# Patient Record
Sex: Female | Born: 1950 | Race: White | Hispanic: No | Marital: Married | Smoking: Never smoker
Health system: Southern US, Community
[De-identification: ages and names within clinical notes are randomized; demographics above are authoritative.]

---

## 2001-07-07 ENCOUNTER — Encounter: Payer: Self-pay | Admitting: Internal Medicine

## 2001-07-07 ENCOUNTER — Encounter: Admission: RE | Admit: 2001-07-07 | Discharge: 2001-07-07 | Payer: Self-pay | Admitting: Internal Medicine

## 2001-07-10 ENCOUNTER — Other Ambulatory Visit: Admission: RE | Admit: 2001-07-10 | Discharge: 2001-07-10 | Payer: Self-pay | Admitting: Internal Medicine

## 2002-07-29 ENCOUNTER — Encounter: Admission: RE | Admit: 2002-07-29 | Discharge: 2002-07-29 | Payer: Self-pay | Admitting: Internal Medicine

## 2002-07-29 ENCOUNTER — Encounter: Payer: Self-pay | Admitting: Internal Medicine

## 2003-08-01 ENCOUNTER — Encounter: Admission: RE | Admit: 2003-08-01 | Discharge: 2003-08-01 | Payer: Self-pay | Admitting: Internal Medicine

## 2004-08-27 ENCOUNTER — Encounter: Admission: RE | Admit: 2004-08-27 | Discharge: 2004-08-27 | Payer: Self-pay | Admitting: Internal Medicine

## 2005-09-11 ENCOUNTER — Encounter: Admission: RE | Admit: 2005-09-11 | Discharge: 2005-09-11 | Payer: Self-pay | Admitting: Internal Medicine

## 2006-09-17 ENCOUNTER — Encounter: Admission: RE | Admit: 2006-09-17 | Discharge: 2006-09-17 | Payer: Self-pay | Admitting: Internal Medicine

## 2007-09-20 ENCOUNTER — Encounter: Admission: RE | Admit: 2007-09-20 | Discharge: 2007-09-20 | Payer: Self-pay | Admitting: Internal Medicine

## 2008-09-20 ENCOUNTER — Encounter: Admission: RE | Admit: 2008-09-20 | Discharge: 2008-09-20 | Payer: Self-pay | Admitting: Internal Medicine

## 2009-09-24 ENCOUNTER — Encounter: Admission: RE | Admit: 2009-09-24 | Discharge: 2009-09-24 | Payer: Self-pay | Admitting: Internal Medicine

## 2009-10-15 ENCOUNTER — Encounter: Admission: RE | Admit: 2009-10-15 | Discharge: 2009-10-15 | Payer: Self-pay | Admitting: Internal Medicine

## 2010-09-27 ENCOUNTER — Other Ambulatory Visit: Payer: Self-pay | Admitting: Internal Medicine

## 2010-09-27 DIAGNOSIS — Z1231 Encounter for screening mammogram for malignant neoplasm of breast: Secondary | ICD-10-CM

## 2010-10-08 ENCOUNTER — Ambulatory Visit
Admission: RE | Admit: 2010-10-08 | Discharge: 2010-10-08 | Disposition: A | Discharge status: Home / Self Care | Payer: BC Managed Care – PPO | Source: Ambulatory Visit | Attending: Internal Medicine | Admitting: Internal Medicine

## 2010-10-08 DIAGNOSIS — Z1231 Encounter for screening mammogram for malignant neoplasm of breast: Secondary | ICD-10-CM

## 2010-10-13 IMAGING — CR DG WRIST COMPLETE 3+V*L*
2 series · 2 of 2 positions shown · non-contrast
Comparison: None.

CLINICAL DATA: Left wrist swelling and pain.

LEFT WRIST - COMPLETE 3+ VIEW

[view not recorded (1 of 2)]
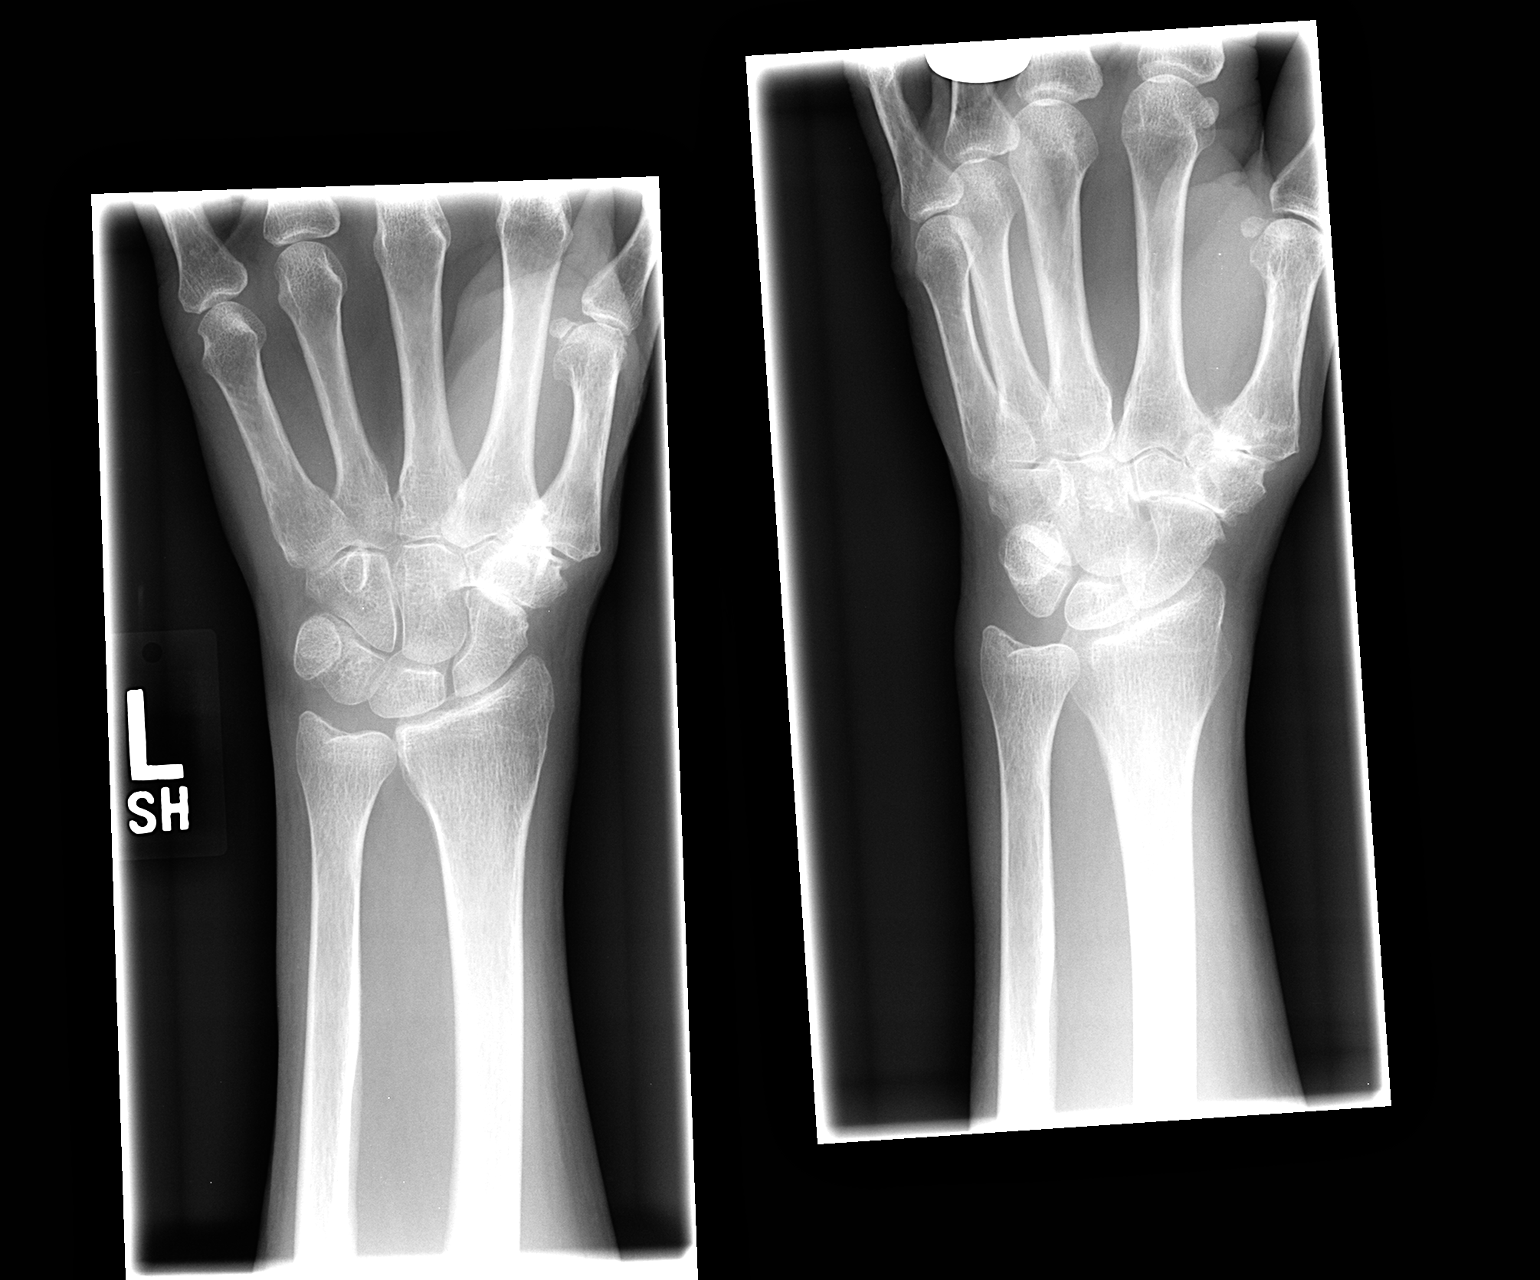

[view not recorded (2 of 2)]
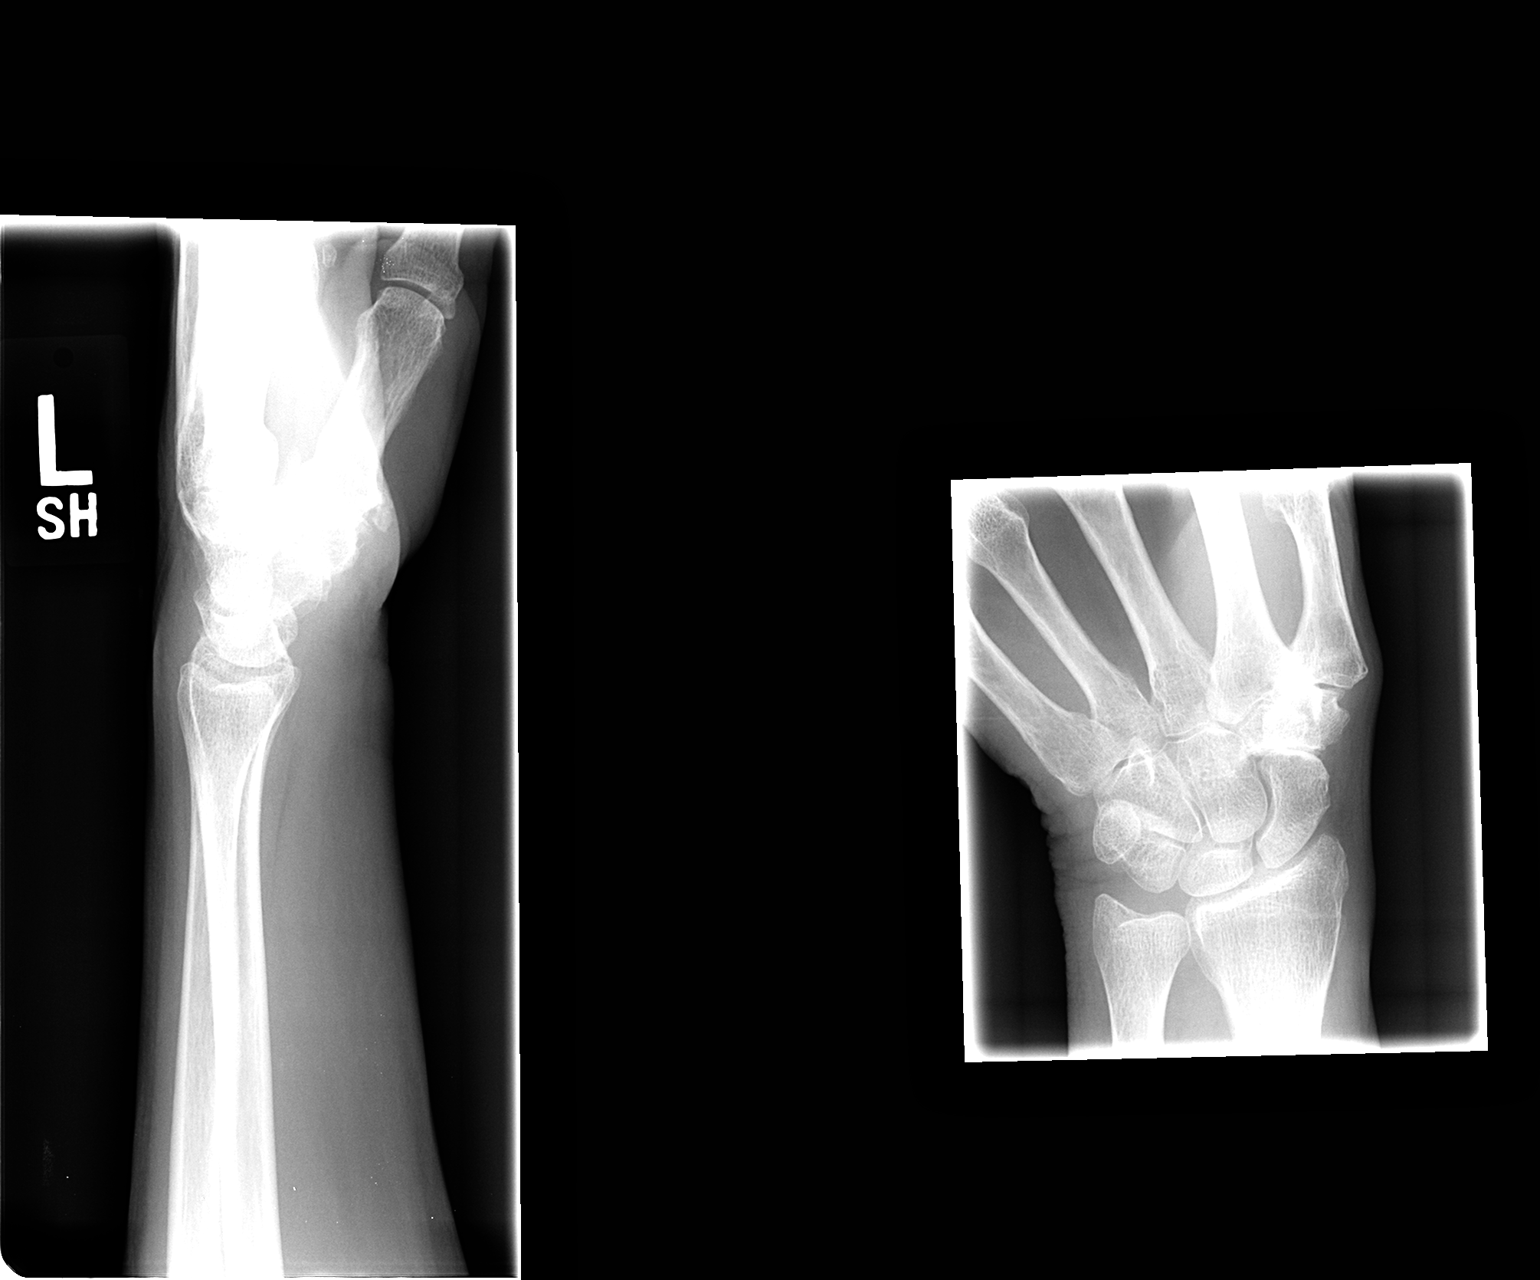

[2 of 2 positions shown; findings below may reference images not displayed]

FINDINGS: There are degenerative changes at the first
carpometacarpal and scaphoid trapezium trapezoid joints.  No
evidence of acute fracture.
IMPRESSION: First carpometacarpal and STT joint osteoarthritis.

## 2019-02-16 DIAGNOSIS — H52223 Regular astigmatism, bilateral: Secondary | ICD-10-CM | POA: Diagnosis not present

## 2019-02-16 DIAGNOSIS — H18429 Band keratopathy, unspecified eye: Secondary | ICD-10-CM | POA: Diagnosis not present

## 2019-02-16 DIAGNOSIS — H5213 Myopia, bilateral: Secondary | ICD-10-CM | POA: Diagnosis not present

## 2019-02-16 DIAGNOSIS — H18603 Keratoconus, unspecified, bilateral: Secondary | ICD-10-CM | POA: Diagnosis not present

## 2019-03-16 ENCOUNTER — Other Ambulatory Visit: Payer: Self-pay

## 2019-03-27 DIAGNOSIS — Z111 Encounter for screening for respiratory tuberculosis: Secondary | ICD-10-CM | POA: Diagnosis not present

## 2019-03-27 DIAGNOSIS — Z0289 Encounter for other administrative examinations: Secondary | ICD-10-CM | POA: Diagnosis not present

## 2019-03-27 DIAGNOSIS — R030 Elevated blood-pressure reading, without diagnosis of hypertension: Secondary | ICD-10-CM | POA: Diagnosis not present

## 2019-07-13 ENCOUNTER — Other Ambulatory Visit: Payer: Self-pay

## 2020-01-31 ENCOUNTER — Encounter (HOSPITAL_COMMUNITY): Payer: Self-pay | Admitting: Emergency Medicine

## 2020-01-31 ENCOUNTER — Other Ambulatory Visit: Payer: Self-pay

## 2020-01-31 DIAGNOSIS — W228XXA Striking against or struck by other objects, initial encounter: Secondary | ICD-10-CM | POA: Diagnosis not present

## 2020-01-31 DIAGNOSIS — Z23 Encounter for immunization: Secondary | ICD-10-CM | POA: Diagnosis not present

## 2020-01-31 DIAGNOSIS — Y999 Unspecified external cause status: Secondary | ICD-10-CM | POA: Diagnosis not present

## 2020-01-31 DIAGNOSIS — Y92019 Unspecified place in single-family (private) house as the place of occurrence of the external cause: Secondary | ICD-10-CM | POA: Diagnosis not present

## 2020-01-31 DIAGNOSIS — S0101XA Laceration without foreign body of scalp, initial encounter: Secondary | ICD-10-CM | POA: Diagnosis not present

## 2020-01-31 DIAGNOSIS — Y9389 Activity, other specified: Secondary | ICD-10-CM | POA: Diagnosis not present

## 2020-01-31 NOTE — ED Triage Notes (Signed)
Pt hit the left side her head on a camper shell and has a laceration, bleeding controlled.

## 2020-02-01 ENCOUNTER — Emergency Department (HOSPITAL_COMMUNITY)
Admission: EM | Admit: 2020-02-01 | Discharge: 2020-02-01 | Disposition: A | Discharge status: Home / Self Care | Payer: PPO | Attending: Emergency Medicine | Admitting: Emergency Medicine

## 2020-02-01 DIAGNOSIS — S0101XA Laceration without foreign body of scalp, initial encounter: Secondary | ICD-10-CM

## 2020-02-01 MED ORDER — TETANUS-DIPHTH-ACELL PERTUSSIS 5-2.5-18.5 LF-MCG/0.5 IM SUSP
0.5000 mL | Freq: Once | INTRAMUSCULAR | Status: AC
  Administered 2020-02-01: 0.5 mL via INTRAMUSCULAR
  Filled 2020-02-01: qty 0.5

## 2020-02-01 MED ORDER — LIDOCAINE-EPINEPHRINE-TETRACAINE (LET) TOPICAL GEL
3.0000 mL | Freq: Once | TOPICAL | Status: AC
  Administered 2020-02-01: 3 mL via TOPICAL
  Filled 2020-02-01: qty 3

## 2020-02-01 NOTE — ED Provider Notes (Signed)
[facility] EMERGENCY DEPARTMENT Provider Note   CSN: [id number] Arrival date & time: 01/31/20  2055   Time seen 1:30 AM  History Chief Complaint  Patient presents with  . Head Laceration    Susan Singh is a 69 y.o. female.  HPI   Patient states she was weed eating at a friend's house at lunchtime on June 15 and she had been bent over while weed eating around his camper which was up on blocks and when she raised up she hit her head on the end of the metal camper lacerating her scalp on the left.  She did not have loss of consciousness.  She denies headache, nausea, vomiting, blurred vision, numbness or tingling of her extremities.  She states the last tetanus she recalls was in 2019.  PCP Patient, No Pcp Per   History reviewed. No pertinent past medical history.  There are no problems to display for this patient.   History reviewed. No pertinent surgical history.   OB History   No obstetric history on file.     No family history on file.  Social History   Tobacco Use  . Smoking status: Never Smoker  . Smokeless tobacco: Never Used  Substance Use Topics  . Alcohol use: Not on file  . Drug use: Not on file    Home Medications Prior to Admission medications   Not on File    Allergies    Patient has no allergy information on record.  Review of Systems   Review of Systems  All other systems reviewed and are negative.   Physical Exam Updated Vital Signs BP (!) 164/75 (BP Location: Right Arm)   Pulse 80   Temp 98.5 F (36.9 C)   Resp 18   Ht 5\' 6"  (1.676 m)   Wt 68.9 kg   SpO2 97%   BMI 24.53 kg/m   Physical Exam Vitals and nursing note reviewed.  Constitutional:      Appearance: Normal appearance. She is normal weight.  HENT:     Head: Normocephalic.      Comments: Patient has a 2-1/2 cm linear laceration on her left scalp that is not actively bleeding.    Right Ear: External ear normal.     Left Ear: External ear normal.  Eyes:      Extraocular Movements: Extraocular movements intact.     Conjunctiva/sclera: Conjunctivae normal.  Cardiovascular:     Rate and Rhythm: Normal rate.  Pulmonary:     Effort: Pulmonary effort is normal. No respiratory distress.  Musculoskeletal:     Cervical back: Normal range of motion.  Skin:    General: Skin is warm and dry.  Neurological:     General: No focal deficit present.     Mental Status: She is alert and oriented to person, place, and time.     Cranial Nerves: No cranial nerve deficit.  Psychiatric:        Mood and Affect: Mood normal.        Behavior: Behavior normal.        Thought Content: Thought content normal.     ED Results / Procedures / Treatments   Labs (all labs ordered are listed, but only abnormal results are displayed) Labs Reviewed - No data to display  EKG None  Radiology No results found.  Procedures . Laceration Repair  Date/Time: 02/01/2020 3:04 AM Performed by: 02/03/2020, MD Authorized by: Devoria Albe, MD   Consent:    Consent obtained:  Verbal  Consent given by:  Patient Anesthesia (see MAR for exact dosages):    Anesthesia method:  Topical application   Topical anesthetic:  LET Laceration details:    Location:  Scalp   Length (cm):  2.5   Laceration depth: through dermis. Repair type:    Repair type:  Simple Pre-procedure details:    Preparation:  Patient was prepped and draped in usual sterile fashion Exploration:    Hemostasis achieved with:  Direct pressure   Wound extent: no foreign bodies/material noted, no muscle damage noted and no vascular damage noted     Contaminated: no   Treatment:    Area cleansed with:  Saline   Amount of cleaning:  Standard Skin repair:    Repair method:  Staples   Number of staples:  3 Approximation:    Approximation:  Loose Post-procedure details:    Patient tolerance of procedure:  Tolerated well, no immediate complications   (including critical care time)  Medications Ordered in  ED Medications  Tdap (BOOSTRIX) injection 0.5 mL (0.5 mLs Intramuscular Given 02/01/20 0200)  lidocaine-EPINEPHrine-tetracaine (LET) topical gel (3 mLs Topical Given 02/01/20 0201)    ED Course  I have reviewed the triage vital signs and the nursing notes.  Pertinent labs & imaging results that were available during my care of the patient were reviewed by me and considered in my medical decision making (see chart for details).    MDM Rules/Calculators/A&P                          Patient's tetanus status was updated with a Boostrix.  Let was placed on the wound for stapling.  We discussed aftercare, keeping it clean and dry, staples need to be removed in a week.  She should return to the ED for evaluation for any problems seen on the head injury sheet.   Final Clinical Impression(s) / ED Diagnoses Final diagnoses:  Laceration of scalp, initial encounter    Rx / DC Orders ED Discharge Orders    None     Plan discharge  Rolland Porter, MD, Barbette Or, MD 02/01/20 [phone]

## 2020-02-01 NOTE — Discharge Instructions (Addendum)
You can go home and wash your hair to get the blood and the ointment out of it.  After that try to keep it clean and dry.  You can put triple antibiotic ointment on it to help prevent infection.  The staples should be removed in 1 week, you can return to the ED for that.  Return sooner if you feel like it is getting infected which would be rare.  Also return to the ED for any problems listed on the head injury sheet.

## 2020-10-16 DIAGNOSIS — H25043 Posterior subcapsular polar age-related cataract, bilateral: Secondary | ICD-10-CM | POA: Diagnosis not present

## 2020-10-16 DIAGNOSIS — H18413 Arcus senilis, bilateral: Secondary | ICD-10-CM | POA: Diagnosis not present

## 2020-10-16 DIAGNOSIS — H18613 Keratoconus, stable, bilateral: Secondary | ICD-10-CM | POA: Diagnosis not present

## 2020-10-16 DIAGNOSIS — H2511 Age-related nuclear cataract, right eye: Secondary | ICD-10-CM | POA: Diagnosis not present

## 2020-10-16 DIAGNOSIS — H2513 Age-related nuclear cataract, bilateral: Secondary | ICD-10-CM | POA: Diagnosis not present

## 2020-10-16 DIAGNOSIS — H25013 Cortical age-related cataract, bilateral: Secondary | ICD-10-CM | POA: Diagnosis not present

## 2020-11-14 DIAGNOSIS — H18613 Keratoconus, stable, bilateral: Secondary | ICD-10-CM | POA: Diagnosis not present

## 2020-11-14 DIAGNOSIS — H18413 Arcus senilis, bilateral: Secondary | ICD-10-CM | POA: Diagnosis not present

## 2020-11-14 DIAGNOSIS — H2513 Age-related nuclear cataract, bilateral: Secondary | ICD-10-CM | POA: Diagnosis not present

## 2020-11-14 DIAGNOSIS — H25013 Cortical age-related cataract, bilateral: Secondary | ICD-10-CM | POA: Diagnosis not present

## 2020-12-25 DIAGNOSIS — Z9842 Cataract extraction status, left eye: Secondary | ICD-10-CM | POA: Diagnosis not present

## 2020-12-25 DIAGNOSIS — H2511 Age-related nuclear cataract, right eye: Secondary | ICD-10-CM | POA: Diagnosis not present

## 2020-12-25 DIAGNOSIS — Z9841 Cataract extraction status, right eye: Secondary | ICD-10-CM | POA: Diagnosis not present

## 2020-12-25 DIAGNOSIS — H25042 Posterior subcapsular polar age-related cataract, left eye: Secondary | ICD-10-CM | POA: Diagnosis not present

## 2020-12-25 DIAGNOSIS — H2512 Age-related nuclear cataract, left eye: Secondary | ICD-10-CM | POA: Diagnosis not present

## 2020-12-25 DIAGNOSIS — Z961 Presence of intraocular lens: Secondary | ICD-10-CM | POA: Diagnosis not present

## 2020-12-25 DIAGNOSIS — H25012 Cortical age-related cataract, left eye: Secondary | ICD-10-CM | POA: Diagnosis not present

## 2021-01-01 DIAGNOSIS — H2512 Age-related nuclear cataract, left eye: Secondary | ICD-10-CM | POA: Diagnosis not present

## 2021-12-09 DIAGNOSIS — H43813 Vitreous degeneration, bilateral: Secondary | ICD-10-CM | POA: Diagnosis not present

## 2021-12-09 DIAGNOSIS — H353111 Nonexudative age-related macular degeneration, right eye, early dry stage: Secondary | ICD-10-CM | POA: Diagnosis not present

## 2021-12-09 DIAGNOSIS — H43393 Other vitreous opacities, bilateral: Secondary | ICD-10-CM | POA: Diagnosis not present

## 2021-12-09 DIAGNOSIS — H353221 Exudative age-related macular degeneration, left eye, with active choroidal neovascularization: Secondary | ICD-10-CM | POA: Diagnosis not present

## 2022-01-06 DIAGNOSIS — H43813 Vitreous degeneration, bilateral: Secondary | ICD-10-CM | POA: Diagnosis not present

## 2022-01-06 DIAGNOSIS — H353221 Exudative age-related macular degeneration, left eye, with active choroidal neovascularization: Secondary | ICD-10-CM | POA: Diagnosis not present

## 2022-01-06 DIAGNOSIS — H353111 Nonexudative age-related macular degeneration, right eye, early dry stage: Secondary | ICD-10-CM | POA: Diagnosis not present

## 2022-01-06 DIAGNOSIS — H43393 Other vitreous opacities, bilateral: Secondary | ICD-10-CM | POA: Diagnosis not present

## 2022-02-05 DIAGNOSIS — H353111 Nonexudative age-related macular degeneration, right eye, early dry stage: Secondary | ICD-10-CM | POA: Diagnosis not present

## 2022-02-05 DIAGNOSIS — H353221 Exudative age-related macular degeneration, left eye, with active choroidal neovascularization: Secondary | ICD-10-CM | POA: Diagnosis not present

## 2022-03-13 DIAGNOSIS — H43393 Other vitreous opacities, bilateral: Secondary | ICD-10-CM | POA: Diagnosis not present

## 2022-03-13 DIAGNOSIS — H353111 Nonexudative age-related macular degeneration, right eye, early dry stage: Secondary | ICD-10-CM | POA: Diagnosis not present

## 2022-03-13 DIAGNOSIS — H43813 Vitreous degeneration, bilateral: Secondary | ICD-10-CM | POA: Diagnosis not present

## 2022-03-13 DIAGNOSIS — H353221 Exudative age-related macular degeneration, left eye, with active choroidal neovascularization: Secondary | ICD-10-CM | POA: Diagnosis not present

## 2022-04-24 DIAGNOSIS — H353221 Exudative age-related macular degeneration, left eye, with active choroidal neovascularization: Secondary | ICD-10-CM | POA: Diagnosis not present

## 2022-04-24 DIAGNOSIS — H353111 Nonexudative age-related macular degeneration, right eye, early dry stage: Secondary | ICD-10-CM | POA: Diagnosis not present

## 2022-06-17 DIAGNOSIS — H5213 Myopia, bilateral: Secondary | ICD-10-CM | POA: Diagnosis not present

## 2022-06-17 DIAGNOSIS — H53143 Visual discomfort, bilateral: Secondary | ICD-10-CM | POA: Diagnosis not present

## 2022-06-17 DIAGNOSIS — H52223 Regular astigmatism, bilateral: Secondary | ICD-10-CM | POA: Diagnosis not present

## 2022-06-18 DIAGNOSIS — H43812 Vitreous degeneration, left eye: Secondary | ICD-10-CM | POA: Diagnosis not present

## 2022-06-18 DIAGNOSIS — H353221 Exudative age-related macular degeneration, left eye, with active choroidal neovascularization: Secondary | ICD-10-CM | POA: Diagnosis not present

## 2022-06-18 DIAGNOSIS — H43392 Other vitreous opacities, left eye: Secondary | ICD-10-CM | POA: Diagnosis not present

## 2022-06-18 DIAGNOSIS — H353111 Nonexudative age-related macular degeneration, right eye, early dry stage: Secondary | ICD-10-CM | POA: Diagnosis not present

## 2022-08-28 DIAGNOSIS — H353111 Nonexudative age-related macular degeneration, right eye, early dry stage: Secondary | ICD-10-CM | POA: Diagnosis not present

## 2022-08-28 DIAGNOSIS — H353221 Exudative age-related macular degeneration, left eye, with active choroidal neovascularization: Secondary | ICD-10-CM | POA: Diagnosis not present

## 2022-08-28 DIAGNOSIS — H43813 Vitreous degeneration, bilateral: Secondary | ICD-10-CM | POA: Diagnosis not present

## 2022-08-28 DIAGNOSIS — H35033 Hypertensive retinopathy, bilateral: Secondary | ICD-10-CM | POA: Diagnosis not present

## 2022-11-20 DIAGNOSIS — H353111 Nonexudative age-related macular degeneration, right eye, early dry stage: Secondary | ICD-10-CM | POA: Diagnosis not present

## 2022-11-20 DIAGNOSIS — H43813 Vitreous degeneration, bilateral: Secondary | ICD-10-CM | POA: Diagnosis not present

## 2022-11-20 DIAGNOSIS — H353221 Exudative age-related macular degeneration, left eye, with active choroidal neovascularization: Secondary | ICD-10-CM | POA: Diagnosis not present

## 2022-11-20 DIAGNOSIS — H35033 Hypertensive retinopathy, bilateral: Secondary | ICD-10-CM | POA: Diagnosis not present

## 2023-01-20 ENCOUNTER — Telehealth: Payer: Self-pay | Admitting: General Practice

## 2023-01-20 NOTE — Telephone Encounter (Signed)
Left message to return call to establish care.

## 2023-02-16 DIAGNOSIS — H35033 Hypertensive retinopathy, bilateral: Secondary | ICD-10-CM | POA: Diagnosis not present

## 2023-02-16 DIAGNOSIS — H353111 Nonexudative age-related macular degeneration, right eye, early dry stage: Secondary | ICD-10-CM | POA: Diagnosis not present

## 2023-02-16 DIAGNOSIS — H43813 Vitreous degeneration, bilateral: Secondary | ICD-10-CM | POA: Diagnosis not present

## 2023-02-16 DIAGNOSIS — H353221 Exudative age-related macular degeneration, left eye, with active choroidal neovascularization: Secondary | ICD-10-CM | POA: Diagnosis not present

## 2023-05-25 DIAGNOSIS — H353221 Exudative age-related macular degeneration, left eye, with active choroidal neovascularization: Secondary | ICD-10-CM | POA: Diagnosis not present

## 2023-05-25 DIAGNOSIS — H353111 Nonexudative age-related macular degeneration, right eye, early dry stage: Secondary | ICD-10-CM | POA: Diagnosis not present

## 2023-05-25 DIAGNOSIS — H35033 Hypertensive retinopathy, bilateral: Secondary | ICD-10-CM | POA: Diagnosis not present

## 2023-05-25 DIAGNOSIS — H43813 Vitreous degeneration, bilateral: Secondary | ICD-10-CM | POA: Diagnosis not present

## 2023-07-27 DIAGNOSIS — J22 Unspecified acute lower respiratory infection: Secondary | ICD-10-CM | POA: Diagnosis not present

## 2023-07-27 DIAGNOSIS — I10 Essential (primary) hypertension: Secondary | ICD-10-CM | POA: Diagnosis not present

## 2023-09-14 DIAGNOSIS — H353111 Nonexudative age-related macular degeneration, right eye, early dry stage: Secondary | ICD-10-CM | POA: Diagnosis not present

## 2023-09-14 DIAGNOSIS — H35033 Hypertensive retinopathy, bilateral: Secondary | ICD-10-CM | POA: Diagnosis not present

## 2023-09-14 DIAGNOSIS — H353221 Exudative age-related macular degeneration, left eye, with active choroidal neovascularization: Secondary | ICD-10-CM | POA: Diagnosis not present

## 2023-09-14 DIAGNOSIS — H43813 Vitreous degeneration, bilateral: Secondary | ICD-10-CM | POA: Diagnosis not present

## 2024-01-18 DIAGNOSIS — H43813 Vitreous degeneration, bilateral: Secondary | ICD-10-CM | POA: Diagnosis not present

## 2024-01-18 DIAGNOSIS — H353111 Nonexudative age-related macular degeneration, right eye, early dry stage: Secondary | ICD-10-CM | POA: Diagnosis not present

## 2024-01-18 DIAGNOSIS — H353221 Exudative age-related macular degeneration, left eye, with active choroidal neovascularization: Secondary | ICD-10-CM | POA: Diagnosis not present

## 2024-01-18 DIAGNOSIS — H35033 Hypertensive retinopathy, bilateral: Secondary | ICD-10-CM | POA: Diagnosis not present

## 2024-05-23 DIAGNOSIS — H35033 Hypertensive retinopathy, bilateral: Secondary | ICD-10-CM | POA: Diagnosis not present

## 2024-05-23 DIAGNOSIS — H26493 Other secondary cataract, bilateral: Secondary | ICD-10-CM | POA: Diagnosis not present

## 2024-05-23 DIAGNOSIS — H353221 Exudative age-related macular degeneration, left eye, with active choroidal neovascularization: Secondary | ICD-10-CM | POA: Diagnosis not present

## 2024-05-23 DIAGNOSIS — H43393 Other vitreous opacities, bilateral: Secondary | ICD-10-CM | POA: Diagnosis not present

## 2024-05-23 DIAGNOSIS — H43813 Vitreous degeneration, bilateral: Secondary | ICD-10-CM | POA: Diagnosis not present

## 2024-05-23 DIAGNOSIS — H353111 Nonexudative age-related macular degeneration, right eye, early dry stage: Secondary | ICD-10-CM | POA: Diagnosis not present

## 2024-05-23 DIAGNOSIS — Z961 Presence of intraocular lens: Secondary | ICD-10-CM | POA: Diagnosis not present
